# Patient Record
Sex: Male | Born: 1990 | Hispanic: No | Marital: Single | Smoking: Never smoker
Health system: Southern US, Community
[De-identification: ages and names within clinical notes are randomized; demographics above are authoritative.]

---
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
## 2018-02-19 ENCOUNTER — Emergency Department (HOSPITAL_COMMUNITY)
Admission: EM | Admit: 2018-02-19 | Discharge: 2018-02-19 | Disposition: A | Discharge status: Home / Self Care | Payer: 59 | Attending: Emergency Medicine | Admitting: Emergency Medicine

## 2018-02-19 ENCOUNTER — Encounter (HOSPITAL_COMMUNITY): Payer: Self-pay | Admitting: Student

## 2018-02-19 ENCOUNTER — Other Ambulatory Visit: Payer: Self-pay

## 2018-02-19 ENCOUNTER — Emergency Department (HOSPITAL_COMMUNITY): Payer: 59

## 2018-02-19 DIAGNOSIS — R1032 Left lower quadrant pain: Secondary | ICD-10-CM | POA: Diagnosis present

## 2018-02-19 DIAGNOSIS — N200 Calculus of kidney: Secondary | ICD-10-CM

## 2018-02-19 LAB — COMPREHENSIVE METABOLIC PANEL
ALBUMIN: 4.4 g/dL (ref 3.5–5.0)
ALK PHOS: 58 U/L (ref 38–126)
ALT: 38 U/L (ref 17–63)
ANION GAP: 12 (ref 5–15)
AST: 35 U/L (ref 15–41)
BUN: 11 mg/dL (ref 6–20)
CALCIUM: 9 mg/dL (ref 8.9–10.3)
CHLORIDE: 106 mmol/L (ref 101–111)
CO2: 21 mmol/L — ABNORMAL LOW (ref 22–32)
CREATININE: 0.94 mg/dL (ref 0.61–1.24)
GFR calc non Af Amer: >60 mL/min (ref >60)
GLUCOSE: 102 mg/dL — AB (ref 65–99)
Potassium: 3 mmol/L — ABNORMAL LOW (ref 3.5–5.1)
Sodium: 139 mmol/L (ref 135–145)
Total Bilirubin: 0.8 mg/dL (ref 0.3–1.2)
Total Protein: 7.9 g/dL (ref 6.5–8.1)

## 2018-02-19 LAB — CBC WITH DIFFERENTIAL/PLATELET
BASOS PCT: 0 %
Basophils Absolute: 0 10*3/uL (ref 0.0–0.1)
EOS ABS: 0.3 10*3/uL (ref 0.0–0.7)
EOS PCT: 3 %
HCT: 43.4 % (ref 39.0–52.0)
HEMOGLOBIN: 15.7 g/dL (ref 13.0–17.0)
Lymphocytes Relative: 50 %
Lymphs Abs: 4.4 10*3/uL — ABNORMAL HIGH (ref 0.7–4.0)
MCH: 28.1 pg (ref 26.0–34.0)
MCHC: 36.2 g/dL — ABNORMAL HIGH (ref 30.0–36.0)
MCV: 77.8 fL — ABNORMAL LOW (ref 78.0–100.0)
Monocytes Absolute: 0.5 10*3/uL (ref 0.1–1.0)
Monocytes Relative: 5 %
NEUTROS PCT: 42 %
Neutro Abs: 3.7 10*3/uL (ref 1.7–7.7)
PLATELETS: 262 10*3/uL (ref 150–400)
RBC: 5.58 MIL/uL (ref 4.22–5.81)
RDW: 12.3 % (ref 11.5–15.5)
WBC: 8.9 10*3/uL (ref 4.0–10.5)

## 2018-02-19 LAB — URINALYSIS, ROUTINE W REFLEX MICROSCOPIC
Bilirubin Urine: NEGATIVE
Glucose, UA: NEGATIVE mg/dL
Ketones, ur: NEGATIVE mg/dL
Leukocytes, UA: NEGATIVE
Nitrite: NEGATIVE
PROTEIN: NEGATIVE mg/dL
Specific Gravity, Urine: 1.021 (ref 1.005–1.030)
Squamous Epithelial / HPF: NONE SEEN
pH: 5 (ref 5.0–8.0)

## 2018-02-19 MED ORDER — KETOROLAC TROMETHAMINE 15 MG/ML IJ SOLN
15.0000 mg | Freq: Once | INTRAMUSCULAR | Status: AC
  Administered 2018-02-19: 15 mg via INTRAVENOUS
  Filled 2018-02-19: qty 1

## 2018-02-19 MED ORDER — SODIUM CHLORIDE 0.9 % IV BOLUS (SEPSIS)
1000.0000 mL | Freq: Once | INTRAVENOUS | Status: AC
  Administered 2018-02-19: 1000 mL via INTRAVENOUS

## 2018-02-19 MED ORDER — ONDANSETRON HCL 4 MG/2ML IJ SOLN
4.0000 mg | Freq: Once | INTRAMUSCULAR | Status: AC
  Administered 2018-02-19: 4 mg via INTRAVENOUS
  Filled 2018-02-19: qty 2

## 2018-02-19 MED ORDER — HYDROCODONE-ACETAMINOPHEN 5-325 MG PO TABS: 1.0000 | ORAL_TABLET | Freq: Four times a day (QID) | ORAL | 0 refills | Status: AC | PRN

## 2018-02-19 MED ORDER — ONDANSETRON HCL 4 MG PO TABS: 4.0000 mg | ORAL_TABLET | Freq: Three times a day (TID) | ORAL | 0 refills | Status: AC | PRN

## 2018-02-19 MED ORDER — IBUPROFEN 800 MG PO TABS: 800.0000 mg | ORAL_TABLET | Freq: Three times a day (TID) | ORAL | 0 refills | Status: AC

## 2018-02-19 MED ORDER — MORPHINE SULFATE (PF) 4 MG/ML IV SOLN
4.0000 mg | Freq: Once | INTRAVENOUS | Status: AC
  Administered 2018-02-19: 4 mg via INTRAVENOUS
  Filled 2018-02-19: qty 1

## 2018-02-19 MED ORDER — TAMSULOSIN HCL 0.4 MG PO CAPS: 0.4000 mg | ORAL_CAPSULE | Freq: Every day | ORAL | 0 refills | Status: AC

## 2018-02-19 NOTE — Discharge Instructions (Signed)
Take ibuprofen 3 times a day with meals.  Do not take other anti-inflammatories at the same time open (Advil, Motrin, naproxen, Aleve).  Take Norco as needed for severe pain.  Do not drive or operate heavy machinery if you are taking this medicine.   Use Zofran as needed for nausea or vomiting. Take Flomax daily. Use the urine strainer to catch the stone. Make sure you are staying well-hydrated with water. Follow-up with the urologist as needed if your symptoms worsen or do not improve in the next 10 days. Return to the emergency room if you develop fevers, chills, persistent vomiting, worsening pain, inability to urinate, or any new or concerning symptoms.

## 2018-02-19 NOTE — ED Triage Notes (Signed)
He c/o sudden, severe left flank pain which started after urinating about 1/2 hour p.t.a. He is writhing, as if in severe pain, and is diaphoretic. His wife and daughter are with him also.

## 2018-02-19 NOTE — ED Triage Notes (Signed)
Note: he refuses to answer any screeningn questions.

## 2018-02-19 NOTE — ED Provider Notes (Signed)
[facility]-EMERGENCY DEPT Provider Note   CSN: [id number] Arrival date & time: 02/19/18  1213     History   Chief Complaint Chief Complaint  Patient presents with  . Flank Pain    HPI Carlos Rocha is a 27 y.o. male presenting for evaluation of LLQ abd pain.   Patient states he had acute onset of left lower quadrant pain after urinating today.  The pain since has been sharp, severe, and constant.  Nothing makes it better, movement and palpation makes it worse.  He reports increased difficulty urinating today.  No radiation of the pain.  He has associated nausea without vomiting.  No history of kidney stones.  He denies fevers, chills, chest pain, shortness of breath, or abnormal bowel movements.  He has no other medical problems, does take medications daily.  No history of abdominal problems, no previous history of abdominal surgeries.  Patient denies pain of the testicle or radiation to the testicle.    . Kidney stone  HPI  History reviewed. No pertinent past medical history.  There are no active problems to display for this patient.   History reviewed. No pertinent surgical history.     Home Medications    Prior to Admission medications   Medication Sig Start Date End Date Taking? Authorizing Provider  HYDROcodone-acetaminophen (NORCO/VICODIN) 5-325 MG tablet Take 1 tablet by mouth every 6 (six) hours as needed. 02/19/18   Maddex Garlitz, PA-C  ibuprofen (ADVIL,MOTRIN) 800 MG tablet Take 1 tablet (800 mg total) by mouth 3 (three) times daily with meals. 02/19/18   Benetta Maclaren, PA-C  ondansetron (ZOFRAN) 4 MG tablet Take 1 tablet (4 mg total) by mouth every 8 (eight) hours as needed for nausea or vomiting. 02/19/18   Syrena Burges, PA-C  tamsulosin (FLOMAX) 0.4 MG CAPS capsule Take 1 capsule (0.4 mg total) by mouth daily. 02/19/18   Joydan Gretzinger, PA-C    Family History No family history on file.  Social History Social History    Tobacco Use  . Smoking status: Not on file  Substance Use Topics  . Alcohol use: Not on file  . Drug use: Not on file     Allergies   Patient has no known allergies.   Review of Systems Review of Systems  Gastrointestinal: Positive for nausea.  Genitourinary: Positive for difficulty urinating.  All other systems reviewed and are negative.    Physical Exam Updated Vital Signs BP 132/74 (BP Location: Right Arm)   Pulse 99   Temp 97.8 F (36.6 C) (Oral)   Resp 18   Ht 6\' 3"  (1.905 m)   Wt 122.5 kg (270 lb)   SpO2 100%   BMI 33.75 kg/m   Physical Exam  Constitutional: He is oriented to person, place, and time. He appears well-developed and well-nourished. No distress.  HENT:  Head: Normocephalic and atraumatic.  Eyes: EOM are normal.  Neck: Normal range of motion.  Cardiovascular: Normal rate, regular rhythm and intact distal pulses.  Pulmonary/Chest: Effort normal and breath sounds normal. No respiratory distress. He has no wheezes.  Abdominal: Soft. He exhibits no distension and no mass. There is tenderness. There is no guarding.  TTP of LLQ. No hernia. No TTP of testicle. Abd soft without rigidity, guarding or distention. No TTP elsewhere in the abd.   Musculoskeletal: Normal range of motion.  Neurological: He is alert and oriented to person, place, and time.  Skin: Skin is warm and dry.  Psychiatric: He has a normal  mood and affect.  Nursing note and vitals reviewed.    ED Treatments / Results  Labs (all labs ordered are listed, but only abnormal results are displayed) Labs Reviewed  URINALYSIS, ROUTINE W REFLEX MICROSCOPIC - Abnormal; Notable for the following components:      Result Value   Hgb urine dipstick LARGE (*)    Bacteria, UA FEW (*)    All other components within normal limits  CBC WITH DIFFERENTIAL/PLATELET - Abnormal; Notable for the following components:   MCV 77.8 (*)    MCHC 36.2 (*)    Lymphs Abs 4.4 (*)    All other components  within normal limits  COMPREHENSIVE METABOLIC PANEL - Abnormal; Notable for the following components:   Potassium 3.0 (*)    CO2 21 (*)    Glucose, Bld 102 (*)    All other components within normal limits  URINE CULTURE    EKG  EKG Interpretation None       Radiology Ct Renal Stone Study  Result Date: 02/19/2018 CLINICAL DATA:  Patient with sudden severe left flank pain. EXAM: CT ABDOMEN AND PELVIS WITHOUT CONTRAST TECHNIQUE: Multidetector CT imaging of the abdomen and pelvis was performed following the standard protocol without IV contrast. COMPARISON:  None. FINDINGS: Lower chest: Normal heart size. No pericardial effusion. Dependent atelectasis within the bilateral lower lobes. Hepatobiliary: Liver is diffusely low in attenuation compatible with steatosis. Gallbladder is unremarkable. No intrahepatic or extrahepatic biliary ductal dilatation. Pancreas: Unremarkable Spleen: Unremarkable Adrenals/Urinary Tract: Adrenal glands are normal. Kidneys are symmetric in size. Moderate left hydroureteronephrosis to the distal left ureter where there is a 3 mm stone (image 95; series 2). Urinary bladder is unremarkable. No additional renal stones are identified. Stomach/Bowel: No abnormal bowel wall thickening or evidence for bowel obstruction. No free fluid or free intraperitoneal air. Normal appendix. Normal morphology of the stomach. Vascular/Lymphatic: Normal caliber abdominal aorta. Prominent subcentimeter retroperitoneal lymph nodes. Reproductive: Prostate is unremarkable. Other: None. Musculoskeletal: No aggressive or acute appearing osseous lesions. IMPRESSION: There is an obstructing 3 mm stone within the distal left ureter resulting in mild to moderate left hydronephrosis. Hepatic steatosis. Electronically Signed   By: Annia Belt M.D.   On: 02/19/2018 14:13    Procedures Procedures (including critical care time)  Medications Ordered in ED Medications  ondansetron (ZOFRAN) injection 4 mg  (4 mg Intravenous Given 02/19/18 1252)  morphine 4 MG/ML injection 4 mg (4 mg Intravenous Given 02/19/18 1252)  ketorolac (TORADOL) 15 MG/ML injection 15 mg (15 mg Intravenous Given 02/19/18 1252)  sodium chloride 0.9 % bolus 1,000 mL (0 mLs Intravenous Stopped 02/19/18 1453)     Initial Impression / Assessment and Plan / ED Course  I have reviewed the triage vital signs and the nursing notes.  Pertinent labs & imaging results that were available during my care of the patient were reviewed by me and considered in my medical decision making (see chart for details).     Patient presenting for evaluation of acute onset left lower quadrant pain associated with urination.  Initially presented tachycardic, and acute pain, and diaphoretic.  Given IVF, morphine, Zofran, and ketorolac.  On exam, patient with tenderness to palpation of left lower quadrant, abdominal exam otherwise reassuring.  No flank pain. No testicular pain, doubt torsion. Patient is afebrile.  Will obtain basic labs, CT renal, and urine.  Urine shows large blood and many red blood cells, no sign of infection or UTI.  CMP without kidney abnormality.  CT shows a  3 mm distal stone.  On reassessment, patient states symptoms are resolved with medication.  Will treat for kidney stone outpatient with follow-up to urology as needed.  Discussed findings and plan with patient.  At this time, patient appears safe for discharge.  Return precautions given.  Patient states he understands and agrees to plan.  Final Clinical Impressions(s) / ED Diagnoses   Final diagnoses:  Nephrolithiasis    ED Discharge Orders        Ordered    ibuprofen (ADVIL,MOTRIN) 800 MG tablet  3 times daily with meals     02/19/18 1434    HYDROcodone-acetaminophen (NORCO/VICODIN) 5-325 MG tablet  Every 6 hours PRN     02/19/18 1434    ondansetron (ZOFRAN) 4 MG tablet  Every 8 hours PRN     02/19/18 1434    tamsulosin (FLOMAX) 0.4 MG CAPS capsule  Daily     02/19/18  1434       Alissa Pharr, PA-C 02/19/18 1612    Little, Ambrose Finlandachel Morgan, MD 02/22/18 2041

## 2018-02-21 LAB — URINE CULTURE: Culture: NO GROWTH

## 2020-04-07 ENCOUNTER — Emergency Department (HOSPITAL_BASED_OUTPATIENT_CLINIC_OR_DEPARTMENT_OTHER): Payer: BC Managed Care – PPO

## 2020-04-07 ENCOUNTER — Other Ambulatory Visit: Payer: Self-pay

## 2020-04-07 ENCOUNTER — Encounter (HOSPITAL_BASED_OUTPATIENT_CLINIC_OR_DEPARTMENT_OTHER): Payer: Self-pay | Admitting: *Deleted

## 2020-04-07 ENCOUNTER — Emergency Department (HOSPITAL_BASED_OUTPATIENT_CLINIC_OR_DEPARTMENT_OTHER)
Admission: EM | Admit: 2020-04-07 | Discharge: 2020-04-07 | Disposition: A | Discharge status: Home / Self Care | Payer: BC Managed Care – PPO | Attending: Emergency Medicine | Admitting: Emergency Medicine

## 2020-04-07 DIAGNOSIS — Y92415 Exit ramp or entrance ramp of street or highway as the place of occurrence of the external cause: Secondary | ICD-10-CM | POA: Insufficient documentation

## 2020-04-07 DIAGNOSIS — M7918 Myalgia, other site: Secondary | ICD-10-CM | POA: Diagnosis not present

## 2020-04-07 DIAGNOSIS — Y9389 Activity, other specified: Secondary | ICD-10-CM | POA: Diagnosis not present

## 2020-04-07 DIAGNOSIS — Y999 Unspecified external cause status: Secondary | ICD-10-CM | POA: Diagnosis not present

## 2020-04-07 DIAGNOSIS — S060X9A Concussion with loss of consciousness of unspecified duration, initial encounter: Secondary | ICD-10-CM

## 2020-04-07 DIAGNOSIS — S0990XA Unspecified injury of head, initial encounter: Secondary | ICD-10-CM | POA: Diagnosis present

## 2020-04-07 DIAGNOSIS — R519 Headache, unspecified: Secondary | ICD-10-CM

## 2020-04-07 MED ORDER — NAPROXEN 500 MG PO TABS: 500.0000 mg | ORAL_TABLET | Freq: Two times a day (BID) | ORAL | 0 refills | Status: DC

## 2020-04-07 MED ORDER — NAPROXEN 500 MG PO TABS: 500.0000 mg | ORAL_TABLET | Freq: Two times a day (BID) | ORAL | 0 refills | Status: AC

## 2020-04-07 MED ORDER — METHOCARBAMOL 500 MG PO TABS: 500.0000 mg | ORAL_TABLET | Freq: Two times a day (BID) | ORAL | 0 refills | Status: AC

## 2020-04-07 MED ORDER — METHOCARBAMOL 500 MG PO TABS: 500.0000 mg | ORAL_TABLET | Freq: Two times a day (BID) | ORAL | 0 refills | Status: DC
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
## 2021-10-22 IMAGING — CT CT HEAD W/O CM
3 series · 15 of 47 positions shown, 18 images · non-contrast
Comparison: The patient's prior CT from 0999 could not be retrieved
for comparison at the time of this dictation.

CLINICAL DATA: Post-traumatic headache.

EXAM:
CT HEAD WITHOUT CONTRAST
TECHNIQUE: Contiguous axial images were obtained from the base of the skull
through the vertex without intravenous contrast.

[Series 2: head wo · axial · 0.45mm/px · z∈[+1278,+1412]mm · 9 of 33 slices shown, 12 images]
[im 3/33  brain]
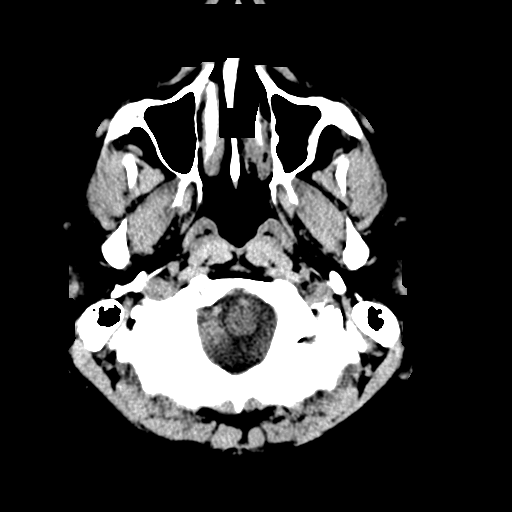
[im 3/33  bone]
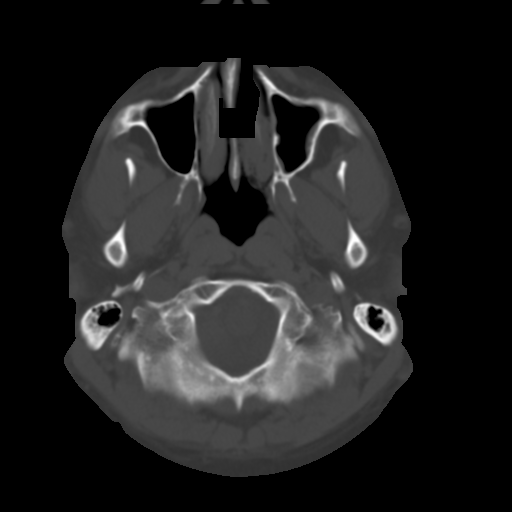
[im 6/33  brain]
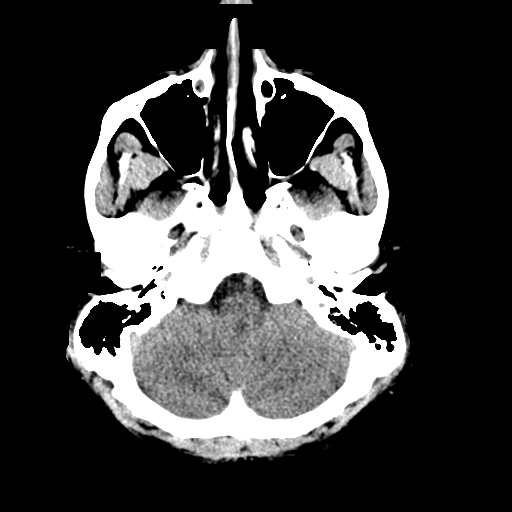
[im 9/33  brain]
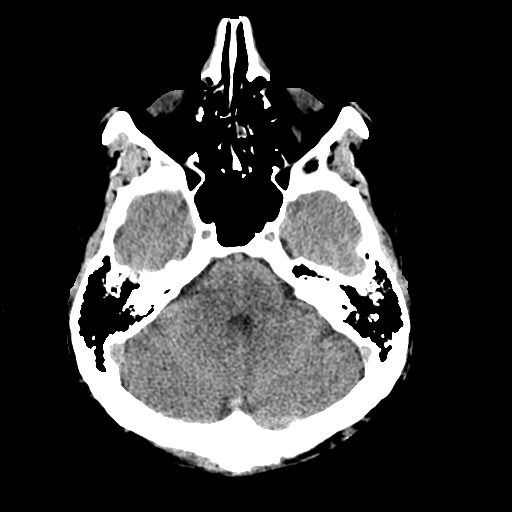
[im 13/33  brain]
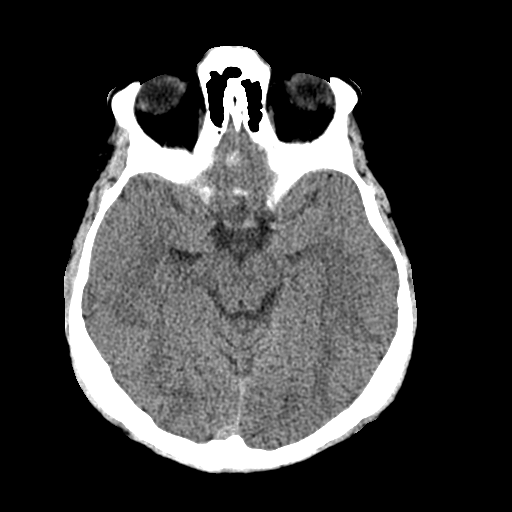
[im 17/33  brain]
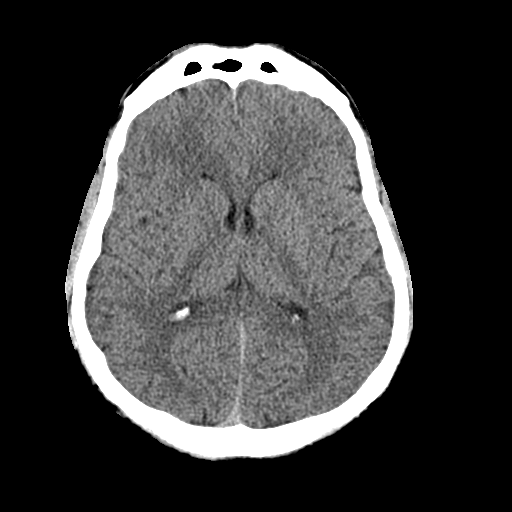
[im 17/33  bone]
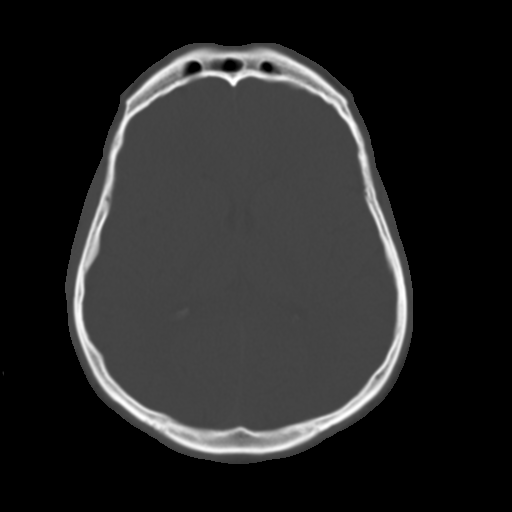
[im 20/33  brain]
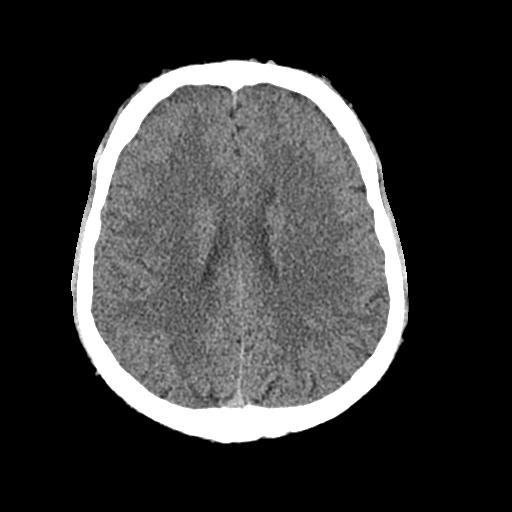
[im 24/33  brain]
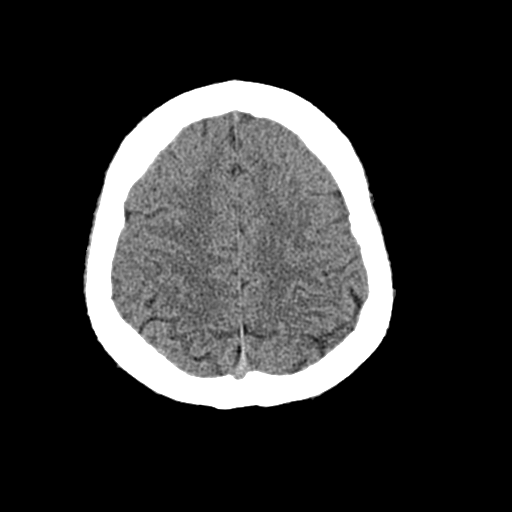
[im 27/33  brain]
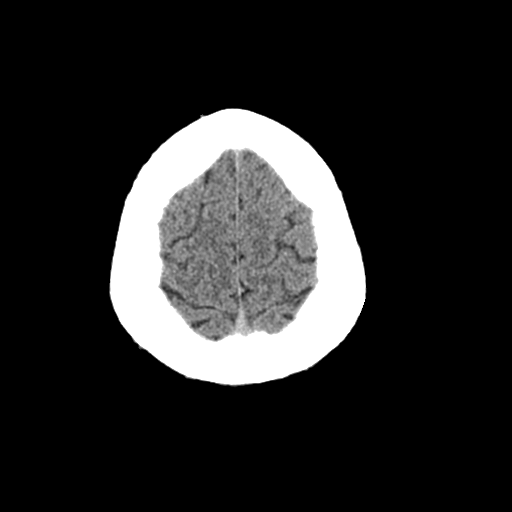
[im 30/33  brain]
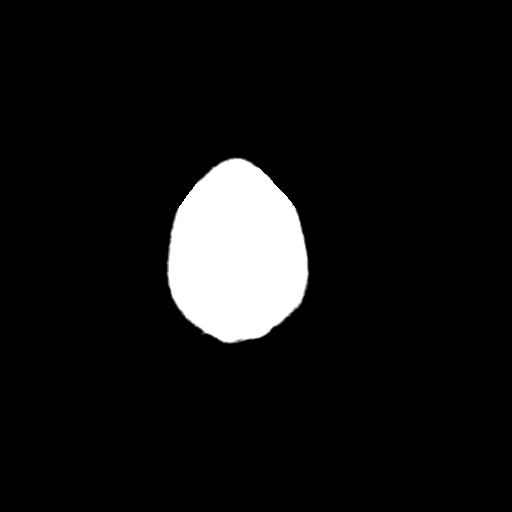
[im 30/33  bone]
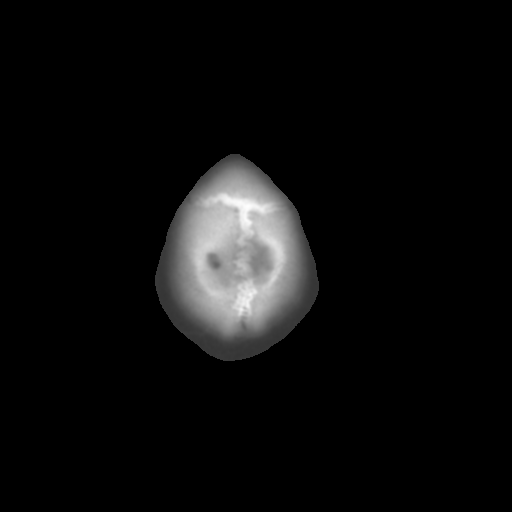

[Series 4: cor soft · coronal · 0.32mm/px · 3 of 84 slices shown]
[im 28/84  brain]
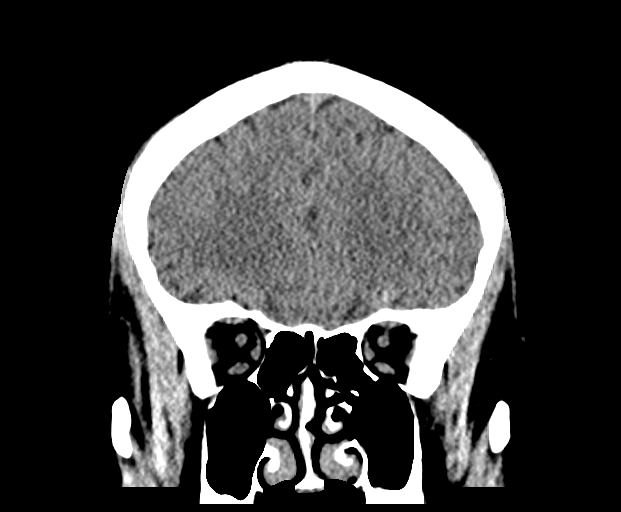
[im 37/84  brain]
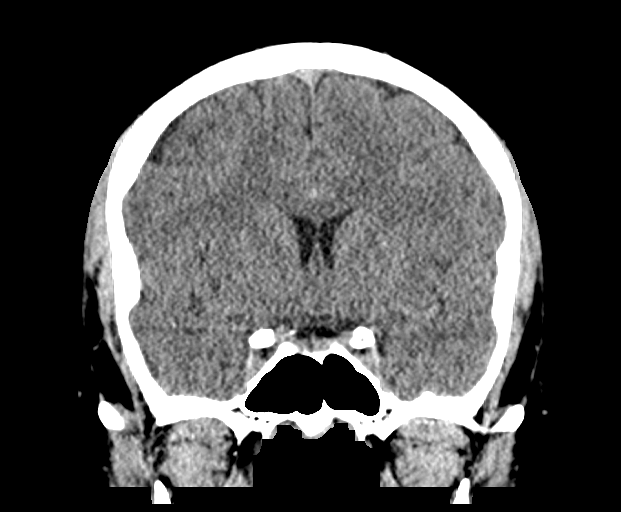
[im 47/84  brain]
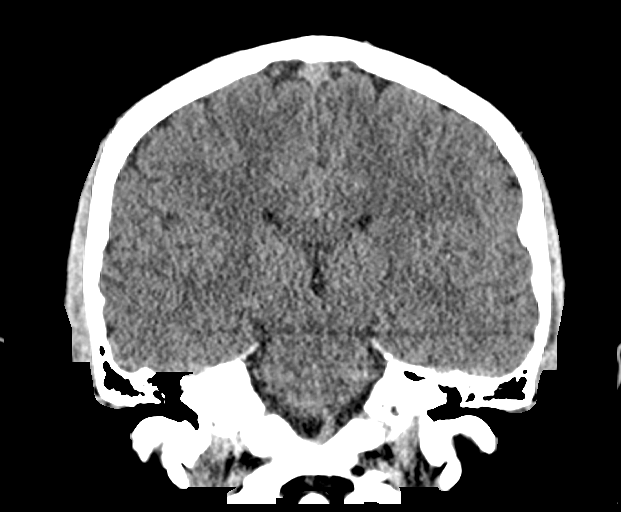

[Series 5: sag soft · sagittal · 0.31mm/px · 3 of 72 slices shown]
[im 24/72  brain]
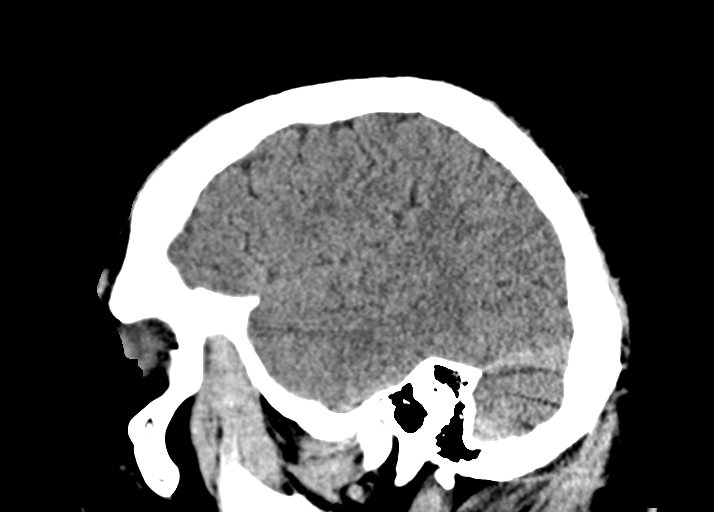
[im 36/72  brain]
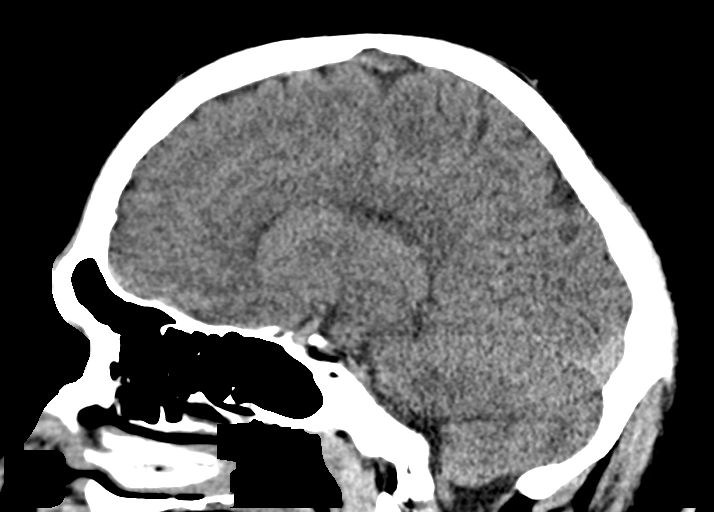
[im 48/72  brain]
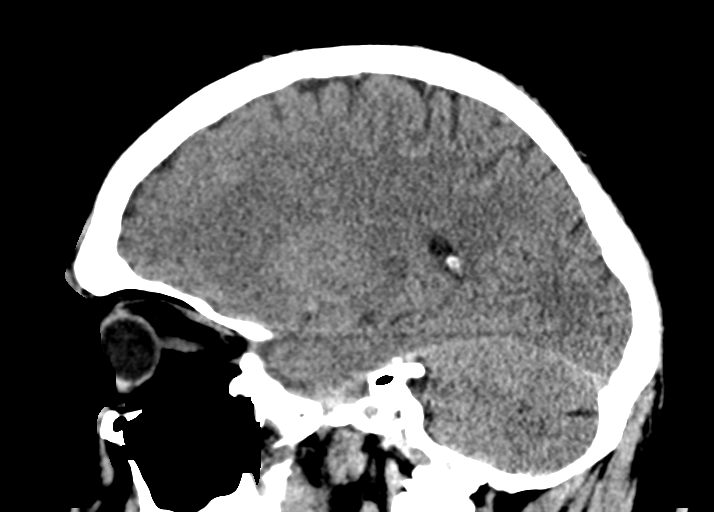

[15 of 47 positions shown; findings below may reference images not displayed]

FINDINGS: Brain: No evidence of acute infarction, hemorrhage, hydrocephalus,
extra-axial collection or mass lesion/mass effect.

Vascular: No hyperdense vessel or unexpected calcification.

Skull: Normal. Negative for fracture or focal lesion.

Sinuses/Orbits: No acute finding.

Other: None.
IMPRESSION: No acute intracranial pathology.
